# Patient Record
Sex: Female | Born: 1980 | Hispanic: No | Marital: Married | Smoking: Never smoker
Health system: Southern US, Community
[De-identification: ages and names within clinical notes are randomized; demographics above are authoritative.]

---

## 2013-08-19 NOTE — L&D Delivery Note (Signed)
Delivery Note At 4:15 AM a viable and healthy female was delivered via Vaginal, Spontaneous Delivery (Presentation: Right Occiput Anterior).  APGAR: 7, 9; weight pending .   Placenta status: Intact, Spontaneous.  Cord: 3 vessels with the following complications: None.    Anesthesia: Epidural  Episiotomy: None Lacerations: 2nd degree;Perineal and 1st degree right labial Suture Repair: 3.0 monocryl Est. Blood Loss (mL): 350   33 y.o. G2P1001 @[redacted]w[redacted]d  presented to MAU in active labor.  She progressed within a few hours to complete and pushed ~30 minutes to deliver.  Infant placed in mother's arms immediately after delivery.  Cord clamping delayed by a few minutes then clamped by CNM and cut by FOB.  Placenta spontaneous and intact.  Pitocin given IV bolus following placenta delivery.  2nd degree perineal tear repaired using 3.0 monocryl, then right labial tear repaired also using 3.0 monocryl.  Mother and baby stable,rooming in.    Mom to postpartum.  Baby to Couplet care / Skin to Skin.  LEFTWICH-KIRBY, Jazyiah Yiu 08/08/2014, 4:46 AM

## 2014-08-07 ENCOUNTER — Inpatient Hospital Stay (HOSPITAL_COMMUNITY): Payer: Self-pay | Admitting: Anesthesiology

## 2014-08-07 ENCOUNTER — Encounter (HOSPITAL_COMMUNITY): Payer: Self-pay | Admitting: *Deleted

## 2014-08-07 ENCOUNTER — Inpatient Hospital Stay (HOSPITAL_COMMUNITY): Payer: Self-pay

## 2014-08-07 ENCOUNTER — Inpatient Hospital Stay (HOSPITAL_COMMUNITY)
Admission: AD | Admit: 2014-08-07 | Discharge: 2014-08-09 | DRG: 775 | Disposition: A | Discharge status: Home / Self Care | Payer: Self-pay | Source: Ambulatory Visit | Attending: Family Medicine | Admitting: Family Medicine

## 2014-08-07 DIAGNOSIS — R58 Hemorrhage, not elsewhere classified: Secondary | ICD-10-CM | POA: Insufficient documentation

## 2014-08-07 DIAGNOSIS — O480 Post-term pregnancy: Principal | ICD-10-CM | POA: Diagnosis present

## 2014-08-07 DIAGNOSIS — IMO0001 Reserved for inherently not codable concepts without codable children: Secondary | ICD-10-CM

## 2014-08-07 DIAGNOSIS — Z3A40 40 weeks gestation of pregnancy: Secondary | ICD-10-CM | POA: Diagnosis present

## 2014-08-07 LAB — RPR

## 2014-08-07 LAB — HIV ANTIBODY (ROUTINE TESTING W REFLEX): HIV 1&2 Ab, 4th Generation: NONREACTIVE

## 2014-08-07 LAB — CBC
HCT: 36.6 % (ref 36.0–46.0)
HEMOGLOBIN: 13 g/dL (ref 12.0–15.0)
MCH: 32.2 pg (ref 26.0–34.0)
MCHC: 35.5 g/dL (ref 30.0–36.0)
MCV: 90.6 fL (ref 78.0–100.0)
Platelets: 115 10*3/uL — ABNORMAL LOW (ref 150–400)
RBC: 4.04 MIL/uL (ref 3.87–5.11)
RDW: 13.1 % (ref 11.5–15.5)
WBC: 7.2 10*3/uL (ref 4.0–10.5)

## 2014-08-07 LAB — DIFFERENTIAL
BASOS PCT: 0 % (ref 0–1)
Basophils Absolute: 0 10*3/uL (ref 0.0–0.1)
EOS ABS: 0.1 10*3/uL (ref 0.0–0.7)
Eosinophils Relative: 1 % (ref 0–5)
Lymphocytes Relative: 27 % (ref 12–46)
Lymphs Abs: 2 10*3/uL (ref 0.7–4.0)
Monocytes Absolute: 0.4 10*3/uL (ref 0.1–1.0)
Monocytes Relative: 6 % (ref 3–12)
Neutro Abs: 4.8 10*3/uL (ref 1.7–7.7)
Neutrophils Relative %: 66 % (ref 43–77)

## 2014-08-07 LAB — HEPATITIS B SURFACE ANTIGEN: Hepatitis B Surface Ag: NEGATIVE

## 2014-08-07 LAB — TYPE AND SCREEN
ABO/RH(D): B POS
ANTIBODY SCREEN: NEGATIVE

## 2014-08-07 MED ORDER — LACTATED RINGERS IV SOLN
500.0000 mL | Freq: Once | INTRAVENOUS | Status: AC
  Administered 2014-08-07: 500 mL via INTRAVENOUS

## 2014-08-07 MED ORDER — CITRIC ACID-SODIUM CITRATE 334-500 MG/5ML PO SOLN: 30.0000 mL | ORAL | Status: DC | PRN

## 2014-08-07 MED ORDER — PHENYLEPHRINE 40 MCG/ML (10ML) SYRINGE FOR IV PUSH (FOR BLOOD PRESSURE SUPPORT)
80.0000 ug | PREFILLED_SYRINGE | INTRAVENOUS | Status: DC | PRN
  Filled 2014-08-07: qty 10

## 2014-08-07 MED ORDER — OXYTOCIN BOLUS FROM INFUSION
500.0000 mL | INTRAVENOUS | Status: DC
  Administered 2014-08-08: 500 mL via INTRAVENOUS

## 2014-08-07 MED ORDER — FENTANYL 2.5 MCG/ML BUPIVACAINE 1/10 % EPIDURAL INFUSION (WH - ANES)
14.0000 mL/h | INTRAMUSCULAR | Status: DC | PRN
  Administered 2014-08-07: 14 mL/h via EPIDURAL
  Filled 2014-08-07: qty 125

## 2014-08-07 MED ORDER — DIPHENHYDRAMINE HCL 50 MG/ML IJ SOLN: 12.5000 mg | INTRAMUSCULAR | Status: DC | PRN

## 2014-08-07 MED ORDER — LIDOCAINE HCL (PF) 1 % IJ SOLN
INTRAMUSCULAR | Status: DC | PRN
  Administered 2014-08-07 (×2): 5 mL

## 2014-08-07 MED ORDER — LACTATED RINGERS IV SOLN
INTRAVENOUS | Status: DC
  Administered 2014-08-07 – 2014-08-08 (×2): via INTRAVENOUS

## 2014-08-07 MED ORDER — LACTATED RINGERS IV SOLN
500.0000 mL | INTRAVENOUS | Status: DC | PRN
  Administered 2014-08-08: 500 mL via INTRAVENOUS

## 2014-08-07 MED ORDER — EPHEDRINE 5 MG/ML INJ: 10.0000 mg | INTRAVENOUS | Status: DC | PRN

## 2014-08-07 MED ORDER — ACETAMINOPHEN 325 MG PO TABS: 650.0000 mg | ORAL_TABLET | ORAL | Status: DC | PRN

## 2014-08-07 MED ORDER — ONDANSETRON HCL 4 MG/2ML IJ SOLN: 4.0000 mg | Freq: Four times a day (QID) | INTRAMUSCULAR | Status: DC | PRN

## 2014-08-07 MED ORDER — OXYCODONE-ACETAMINOPHEN 5-325 MG PO TABS: 2.0000 | ORAL_TABLET | ORAL | Status: DC | PRN

## 2014-08-07 MED ORDER — PHENYLEPHRINE 40 MCG/ML (10ML) SYRINGE FOR IV PUSH (FOR BLOOD PRESSURE SUPPORT): 80.0000 ug | PREFILLED_SYRINGE | INTRAVENOUS | Status: DC | PRN

## 2014-08-07 MED ORDER — OXYTOCIN 40 UNITS IN LACTATED RINGERS INFUSION - SIMPLE MED
62.5000 mL/h | INTRAVENOUS | Status: DC
  Filled 2014-08-07: qty 1000

## 2014-08-07 MED ORDER — LIDOCAINE HCL (PF) 1 % IJ SOLN
30.0000 mL | INTRAMUSCULAR | Status: DC | PRN
  Filled 2014-08-07: qty 30

## 2014-08-07 MED ORDER — OXYCODONE-ACETAMINOPHEN 5-325 MG PO TABS: 1.0000 | ORAL_TABLET | ORAL | Status: DC | PRN

## 2014-08-07 NOTE — Anesthesia Procedure Notes (Signed)
Epidural Patient location during procedure: OB Start time: 08/07/2014 11:42 PM  Staffing Anesthesiologist: Brayton CavesJACKSON, Angie Piercey Performed by: anesthesiologist   Preanesthetic Checklist Completed: patient identified, site marked, surgical consent, pre-op evaluation, timeout performed, IV checked, risks and benefits discussed and monitors and equipment checked  Epidural Patient position: sitting Prep: site prepped and draped and DuraPrep Patient monitoring: continuous pulse ox and blood pressure Approach: midline Location: L3-L4 Injection technique: LOR air  Needle:  Needle type: Tuohy  Needle gauge: 17 G Needle length: 9 cm and 9 Needle insertion depth: 5 cm cm Catheter type: closed end flexible Catheter size: 19 Gauge Catheter at skin depth: 10 cm Test dose: negative  Assessment Events: blood not aspirated, injection not painful, no injection resistance, negative IV test and no paresthesia  Additional Notes Patient identified.  Risk benefits discussed including failed block, incomplete pain control, headache, nerve damage, paralysis, blood pressure changes, nausea, vomiting, reactions to medication both toxic or allergic, and postpartum back pain.  Patient expressed understanding and wished to proceed.  All questions were answered.  Sterile technique used throughout procedure and epidural site dressed with sterile barrier dressing. No paresthesia or other complications noted.The patient did not experience any signs of intravascular injection such as tinnitus or metallic taste in mouth nor signs of intrathecal spread such as rapid motor block. Please see nursing notes for vital signs.

## 2014-08-07 NOTE — MAU Note (Signed)
Pt presents to MAU with complaints of contractions with vaginal bleeding for 4-5 hours. Reports baby is active

## 2014-08-07 NOTE — Anesthesia Preprocedure Evaluation (Signed)

## 2014-08-07 NOTE — Plan of Care (Signed)
Problem: Consults Goal: Birthing Suites Patient Information Press F2 to bring up selections list  Outcome: Completed/Met Date Met:  08/07/14  Pt 37-[redacted] weeks EGA and Non-English Speaking

## 2014-08-07 NOTE — MAU Provider Note (Signed)
  History     CSN: [id number]  Arrival date and time: 08/07/14 1742   First Provider Initiated Contact with Patient 08/07/14 1828      Chief Complaint  Patient presents with  . Vaginal Bleeding  . Contractions   HPI This is a 33 y.o. female at 5559w0d by LMP who presents with c/o contractions and vaginal bleeding for the past 5 hours. States contractions are only every 20 min. [place].  Does not have any records "because they are all on computer there".  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 States her blood type is B+ and her placenta "is in a good position" but does not know where. States everything has been normal except she has had some low platelets at 105K. States last delivery was normal and lasted 15 hours. [facility]. Does not know how long she will be here but will go back home after the baby is born.   RN note: Pt presents to MAU with complaints of contractions with vaginal bleeding for 4-5 hours. Reports baby is active          OB History    Gravida Para Term Preterm AB TAB SAB Ectopic Multiple Living   2 1 1       1       History reviewed. No pertinent past medical history.  History reviewed. No pertinent past surgical history.  History reviewed. No pertinent family history.  History  Substance Use Topics  . Smoking status: Never Smoker   . Smokeless tobacco: Not on file  . Alcohol Use: Not on file    Allergies: No Known Allergies  No prescriptions prior to admission    Review of Systems  Constitutional: Negative for fever, chills and malaise/fatigue.  Gastrointestinal: Positive for abdominal pain. Negative for nausea and vomiting.  Genitourinary: Negative for dysuria.       Vaginal bleeding   Neurological: Negative for dizziness and headaches.   Physical Exam   Blood pressure 130/66, pulse 100, temperature 99 F (37.2 C), resp. rate 18, height 5\' 3"  (1.6  m), weight 139 lb (63.05 kg), last menstrual period 10/31/2013.  Physical Exam  Constitutional: She is oriented to person, place, and time. She appears well-developed and well-nourished. No distress (but appears uncomfortable).  HENT:  Head: Normocephalic.  Cardiovascular: Normal rate.   Respiratory: Effort normal.  GI: Soft.  Genitourinary: Vaginal discharge (blood tinged mucous) found.  Dilation: 1 Effacement (%): 70 Cervical Position: Posterior Station: -2 Presentation: Vertex Exam by:: Artelia LarocheM. Chalet Kerwin CNM   Musculoskeletal: Normal range of motion.  Neurological: She is alert and oriented to person, place, and time.  Skin: Skin is warm and dry.  Psychiatric: She has a normal mood and affect.   FHR reactive UCs irregular, every 2-8 min  MAU Course  Procedures  MDM Prenatal labs, limited US (to check placenta) and cultures ordered, including GBS.  Assessment and Plan  Report to oncoming CNM  [facility] LLCWILLIAMS,Dalon Reichart 08/07/2014, 6:41 PM

## 2014-08-07 NOTE — MAU Note (Signed)
Pt has had no PNC and reports LMP was March 15th of 2015

## 2014-08-07 NOTE — H&P (Signed)
Erika Owen is a 33 y.o. female G2P1001 @[redacted]w[redacted]d  [place] in [place] presenting for labor evaluation.  She reports onset of contractions during the day today, becoming stronger and closer together before arrival in MAU.  She does not have records of her prenatal care but reports her pregnancy course has been normal and her previous pregnancy was normal with term NSVD.  She reports good fetal movement, denies LOF, vaginal bleeding, vaginal itching/burning, urinary symptoms, h/a, dizziness, n/v, or fever/chills.     Maternal Medical History:  Reason for admission: Nausea.    OB History    Gravida Para Term Preterm AB TAB SAB Ectopic Multiple Living   2 1 1       1      History reviewed. No pertinent past medical history. History reviewed. No pertinent past surgical history. Family History: family history is not on file. Social History:  reports that she has never smoked. She does not have any smokeless tobacco history on file. Her alcohol and drug histories are not on file.   Prenatal Transfer Tool  Maternal Diabetes: No Genetic Screening: Normal Maternal Ultrasounds/Referrals: Normal Fetal Ultrasounds or other Referrals:  None Maternal Substance Abuse:  No Significant Maternal Medications:  None Significant Maternal Lab Results:  Lab values include: Other:  Other Comments:  GBS unkown--prenatal records unavailable from [place] but labs drawn on admission  Review of Systems  Constitutional: Negative for fever, chills and malaise/fatigue.  Eyes: Negative for blurred vision.  Respiratory: Negative for cough and shortness of breath.   Cardiovascular: Negative for chest pain.  Gastrointestinal: Positive for abdominal pain. Negative for heartburn, nausea and vomiting.  Genitourinary: Negative for dysuria, urgency and frequency.  Musculoskeletal: Negative.   Neurological: Negative for dizziness and headaches.  Psychiatric/Behavioral: Negative for depression.    Dilation:  3 Effacement (%): 80 Station: -2 Exam by:: Sharen CounterLisa Leftwich Kirby CNM Blood pressure 130/66, pulse 100, temperature 99 F (37.2 C), resp. rate 18, height 5\' 3"  (1.6 m), weight 63.05 kg (139 lb), last menstrual period 10/31/2013. Maternal Exam:  Uterine Assessment: Contraction strength is moderate.  Contraction duration is 1 minute. Contraction frequency is regular.   Abdomen: Fetal presentation: vertex  Cervix: Cervix evaluated by digital exam.     Fetal Exam Fetal Monitor Review: Mode: ultrasound.   Baseline rate: 135.  Variability: moderate (6-25 bpm).   Pattern: accelerations present.    Fetal State Assessment: Category I - tracings are normal.     Physical Exam  Nursing note and vitals reviewed. Constitutional: She is oriented to person, place, and time. She appears well-developed and well-nourished.  Neck: Normal range of motion.  Cardiovascular: Normal rate, regular rhythm and normal heart sounds.   Respiratory: Effort normal.  GI: Soft.  Musculoskeletal: Normal range of motion.  Neurological: She is alert and oriented to person, place, and time. She has normal reflexes.  Skin: Skin is warm and dry.  Psychiatric: She has a normal mood and affect. Her behavior is normal. Judgment and thought content normal.    Prenatal labs: ABO, Rh: --/--/B POS (12/20 1850) Antibody: NEG (12/20 1850) Rubella:   RPR: NON REAC (12/20 1850)  HBsAg:    HIV: NONREACTIVE (12/20 1850)  GBS:   Unknown--rapid GBS collected on admission  Assessment/Plan: .33 y.o. G2P1001 @[redacted]w[redacted]d  Active labor at term GBS unknown at term  [facility] GBS collected Prenatal labs pending May have epidural when desired Anticipate NSVD   LEFTWICH-KIRBY, Erika Owen 08/07/2014, 9:55 PM

## 2014-08-08 ENCOUNTER — Encounter (HOSPITAL_COMMUNITY): Payer: Self-pay

## 2014-08-08 DIAGNOSIS — Z3A40 40 weeks gestation of pregnancy: Secondary | ICD-10-CM

## 2014-08-08 DIAGNOSIS — R58 Hemorrhage, not elsewhere classified: Secondary | ICD-10-CM | POA: Insufficient documentation

## 2014-08-08 DIAGNOSIS — O480 Post-term pregnancy: Secondary | ICD-10-CM

## 2014-08-08 LAB — CBC
HCT: 34.8 % — ABNORMAL LOW (ref 36.0–46.0)
HEMOGLOBIN: 12.2 g/dL (ref 12.0–15.0)
MCH: 31.9 pg (ref 26.0–34.0)
MCHC: 35.1 g/dL (ref 30.0–36.0)
MCV: 90.9 fL (ref 78.0–100.0)
Platelets: 107 10*3/uL — ABNORMAL LOW (ref 150–400)
RBC: 3.83 MIL/uL — ABNORMAL LOW (ref 3.87–5.11)
RDW: 13 % (ref 11.5–15.5)
WBC: 9.9 10*3/uL (ref 4.0–10.5)

## 2014-08-08 LAB — URINALYSIS, ROUTINE W REFLEX MICROSCOPIC
Bilirubin Urine: NEGATIVE
Glucose, UA: NEGATIVE mg/dL
Hgb urine dipstick: NEGATIVE
Ketones, ur: >80 mg/dL — AB
Leukocytes, UA: NEGATIVE
NITRITE: NEGATIVE
PROTEIN: NEGATIVE mg/dL
SPECIFIC GRAVITY, URINE: 1.025 (ref 1.005–1.030)
UROBILINOGEN UA: 0.2 mg/dL (ref 0.0–1.0)
pH: 6 (ref 5.0–8.0)

## 2014-08-08 LAB — ABO/RH: ABO/RH(D): B POS

## 2014-08-08 LAB — RAPID URINE DRUG SCREEN, HOSP PERFORMED
Amphetamines: NOT DETECTED
BARBITURATES: NOT DETECTED
BENZODIAZEPINES: NOT DETECTED
Cocaine: NOT DETECTED
Opiates: NOT DETECTED
TETRAHYDROCANNABINOL: NOT DETECTED

## 2014-08-08 LAB — GC/CHLAMYDIA PROBE AMP
CT Probe RNA: NEGATIVE
GC Probe RNA: NEGATIVE

## 2014-08-08 LAB — RUBELLA SCREEN: RUBELLA: 3.03 {index} — AB (ref <0.90)

## 2014-08-08 MED ORDER — OXYCODONE-ACETAMINOPHEN 5-325 MG PO TABS: 1.0000 | ORAL_TABLET | ORAL | Status: DC | PRN

## 2014-08-08 MED ORDER — LANOLIN HYDROUS EX OINT
TOPICAL_OINTMENT | CUTANEOUS | Status: DC | PRN
Start: 2014-08-08 — End: 2014-08-09

## 2014-08-08 MED ORDER — ZOLPIDEM TARTRATE 5 MG PO TABS: 5.0000 mg | ORAL_TABLET | Freq: Every evening | ORAL | Status: DC | PRN

## 2014-08-08 MED ORDER — SENNOSIDES-DOCUSATE SODIUM 8.6-50 MG PO TABS
2.0000 | ORAL_TABLET | ORAL | Status: DC
  Filled 2014-08-08: qty 2

## 2014-08-08 MED ORDER — TETANUS-DIPHTH-ACELL PERTUSSIS 5-2.5-18.5 LF-MCG/0.5 IM SUSP: 0.5000 mL | Freq: Once | INTRAMUSCULAR | Status: DC

## 2014-08-08 MED ORDER — OXYCODONE-ACETAMINOPHEN 5-325 MG PO TABS: 2.0000 | ORAL_TABLET | ORAL | Status: DC | PRN

## 2014-08-08 MED ORDER — ONDANSETRON HCL 4 MG PO TABS: 4.0000 mg | ORAL_TABLET | ORAL | Status: DC | PRN

## 2014-08-08 MED ORDER — DIPHENHYDRAMINE HCL 25 MG PO CAPS: 25.0000 mg | ORAL_CAPSULE | Freq: Four times a day (QID) | ORAL | Status: DC | PRN

## 2014-08-08 MED ORDER — SIMETHICONE 80 MG PO CHEW: 80.0000 mg | CHEWABLE_TABLET | ORAL | Status: DC | PRN

## 2014-08-08 MED ORDER — ONDANSETRON HCL 4 MG/2ML IJ SOLN: 4.0000 mg | INTRAMUSCULAR | Status: DC | PRN

## 2014-08-08 MED ORDER — DIBUCAINE 1 % RE OINT
1.0000 "application " | TOPICAL_OINTMENT | RECTAL | Status: DC | PRN
  Filled 2014-08-08: qty 28

## 2014-08-08 MED ORDER — BENZOCAINE-MENTHOL 20-0.5 % EX AERO
1.0000 "application " | INHALATION_SPRAY | CUTANEOUS | Status: DC | PRN
  Administered 2014-08-08: 1 via TOPICAL
  Filled 2014-08-08 (×2): qty 56

## 2014-08-08 MED ORDER — WITCH HAZEL-GLYCERIN EX PADS: 1.0000 "application " | MEDICATED_PAD | CUTANEOUS | Status: DC | PRN

## 2014-08-08 MED ORDER — IBUPROFEN 600 MG PO TABS
600.0000 mg | ORAL_TABLET | Freq: Four times a day (QID) | ORAL | Status: DC
  Administered 2014-08-08: 600 mg via ORAL
  Filled 2014-08-08 (×3): qty 1

## 2014-08-08 MED ORDER — PRENATAL MULTIVITAMIN CH
1.0000 | ORAL_TABLET | Freq: Every day | ORAL | Status: DC
  Filled 2014-08-08: qty 1

## 2014-08-08 NOTE — Progress Notes (Signed)
Erika Owen is a 33 y.o. G2P1001 at 7271w1d by LMP admitted for active labor  Subjective: Pt comfortable now with epidural.    Objective: BP 105/65 mmHg  Pulse 77  Temp(Src) 97.8 F (36.6 C) (Oral)  Resp 18  Ht 5\' 3"  (1.6 m)  Wt 63.05 kg (139 lb)  BMI 24.63 kg/m2  SpO2 100%  LMP 10/31/2013      FHT:  FHR: 120 bpm, variability: moderate,  accelerations:  Present,  decelerations:  Absent UC:   regular, every 5-6 minutes SVE:   Dilation: 5 Effacement (%): 80 Station: -1 Exam by:: e. poore, rn  Labs: Lab Results  Component Value Date   WBC 7.2 08/07/2014   HGB 13.0 08/07/2014   HCT 36.6 08/07/2014   MCV 90.6 08/07/2014   PLT 115* 08/07/2014    Assessment / Plan: Spontaneous labor, progressing normally  Labor: Progressing normally Preeclampsia:  n/a Fetal Wellbeing:  Category I Pain Control:  Epidural I/D:  n/a Anticipated MOD:  NSVD  LEFTWICH-KIRBY, [name] 08/08/2014, 12:12 AM 08/08/2014, 12:12 AM

## 2014-08-08 NOTE — Anesthesia Postprocedure Evaluation (Signed)
Anesthesia Post Note  Patient: Erika Owen  Procedure(s) Performed: * No procedures listed *  Anesthesia type: Epidural  Patient location: Mother/Baby  Post pain: Pain level controlled  Post assessment: Post-op Vital signs reviewed  Last Vitals:  Filed Vitals:   08/08/14 0705  BP: 102/63  Pulse: 72  Temp: 36.4 C  Resp: 18    Post vital signs: Reviewed  Level of consciousness:alert  Complications: No apparent anesthesia complications

## 2014-08-08 NOTE — Lactation Note (Signed)
This note was copied from the chart of Erika Owen. Lactation Consultation Note Initial visit at 12 hours of age.  Mom is asleep, but FOB speaks good english and shares baby just finished a feeding.  Baby is now asleep in crib.  Baby has had 6 feedings with a void and stool.   FOB declines assistance.  [facility] resources given and discussed.  Encouraged FOB to have mom to call for assist as needed, FOB further declines assistance, reporting everyone in their family breastfeeds.  Discussed usual output for 1st week of life.  [facility] Kristen who will again ask mom when she is awake if she needs Lactation services.  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       Patient Name: Erika Breahna Larose Today's Date: 08/08/2014 Reason for consult: Initial assessment   Maternal Data    Feeding Feeding Type: Breast Fed Length of feed: 15 min  LATCH Score/Interventions                      Lactation Tools Discussed/Used     Consult Status Consult Status: Complete    Vanessia Bokhari, Arvella MerlesJana Lynn 08/08/2014, 4:23 PM

## 2014-08-08 NOTE — Progress Notes (Signed)
UR chart review completed.  

## 2014-08-09 MED ORDER — IBUPROFEN 600 MG PO TABS: 600.0000 mg | ORAL_TABLET | Freq: Four times a day (QID) | ORAL | Status: AC

## 2014-08-09 NOTE — Progress Notes (Signed)
Clinical Social Work Department PSYCHOSOCIAL ASSESSMENT - MATERNAL/CHILD Late Entry 08/08/2014  Patient:  ANTIA, RAHAL  Account Number:  [id number]  Admit Date:  08/07/2014  Ardine Eng Name:   Genene Churn   Clinical Social Worker:  Lucita Ferrara, CLINICAL SOCIAL WORKER   Date/Time:  08/08/2014 05:00 PM  Date Referred:  08/08/2014   Referral source  Central Nursery     Referred reason  [facility]   Other referral source:    I:  FAMILY / HOME ENVIRONMENT Child's legal guardian:  PARENT  Guardian - Name Guardian - Age Guardian - Address  [address], [place] [address]  Father of the baby  same as above   Other household support members/support persons Name Relationship DOB   DAUGHTER 54 years old   Other support:   MOB and FOB reported that they have one friend in [place] that has assisted them navigate [place] and available resources.    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   Financial resources:  Self Pay If Medicaid - County:    School / Grade:  N/A Music therapist / Child Services Coordination / Early Interventions:   None reported  Cultural issues impacting care:   MOB and FOB moved from [place] and then [place] during final trimester of the pregnancy.  MOB and FOB reported concerns about financial ability to pay for the hospitalization due to not having access to insurance. They originally declined services at the hospital out of fear of the cost, but have been agreeable and understanding of hospital policy when meeting with the MD and the CSW.    III  STRENGTHS Strengths  Adequate Resources  Home prepared for Child (including basic supplies)   Strength comment:    IV  RISK FACTORS AND CURRENT PROBLEMS Current Problem:  YES   Risk  Factor & Current Problem Patient Issue Family Issue Risk Factor / Current Problem Comment  Other - See comment Y N MOB and FOB report being refugees from [place] (via [place]).  Unable to verify prenatal records. Baby's UDS is negative.  Other - See comment Y N MOB and FOB concerned about cost of hospital bill and originally declined treatment at the hospital and outpatient follow-up out of fear of cost.    V  SOCIAL WORK ASSESSMENT CSW met with the MOB due to inability to confirm prenatal care.  MOB and FOB reported that they are [place] refugees, received prenatal care prior to arrival the [place], but have not established care in the [place].  CSW also received phone call from pediatrician due to MOB and FOB originally requesting discharge prior to baby's readiness and potential concerns since the MOB and FOB were discussing ability to be the baby's pediatricians since they are in doctors in [place].  MOB and FOB were receptive to the visit from [place].  The FOB provided majority of the history, and the MOB was observed to be attending to their older daughter and the baby.    Per FOB, they are [place] refugees, but discussed that they do not have a refugee visa.  He shared that they arrived on their own (without a sponsorship/agency), and have one friend that lives in [place]. He discussed belief that the friend is supportive and has been helpful for them as  they learn to navigate resources.  The FOB shared that they have an apartment, and enough money to pay their basic bills/rent. 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  The FOB did not identify any long term plan in terms of their living arrangements in the [place], but confirmed belief that he is able to ensure that all basic needs are met.    The FOB expressed concern about length of stay at the hospital and need to follow-up with a pediatrician due to cost.  He repeatedly stated that he is unsure how  they are going to be able to pay for the hospital stay or the follow-up appointments since they only have enough to pay for their basic everyday needs.  He shared that he was only requesting an early discharge out of concern of the hospital bill since they do not have insurance.   The FOB verbalized understanding of hospital protocol for length of stay and need for follow-up, and was agreeable.  He stated belief that his family is in "good hands", and shared that there health is his top priority. The FOB acknowledged that he will receive extra support/assistance when needing to identify a pediatrician. The FOB also confirmed that he has spoken with the financial counselor at the hospital regarding Medicaid.   CSW also discussed hospital drug screen policy due to inability to confirm medical records. The FOB denied any drug use during the pregnancy, but verbalized understanding of the policy.  He repeatedly stated that he and the MOB were agreeable to any and all policies at the hospital if it is in the best interest of the family's health.    No barriers to discharge.   VI SOCIAL WORK PLAN Social Work Secretary/administrator Education   Type of pt/family education:   Hospital drug screen policy   If child protective services report - county:   If child protective services report - date:   Information/referral to community resources comment:   Other social work plan:   CSW to follow-up PRN.

## 2014-08-09 NOTE — Discharge Summary (Signed)
Obstetric Discharge Summary Reason for Admission: onset of labor Prenatal Procedures: none Intrapartum Procedures: spontaneous vaginal delivery Postpartum Procedures: none Complications-Operative and Postpartum: 1st and 2nd  degree perineal laceration HEMOGLOBIN  Date Value Ref Range Status  08/08/2014 12.2 12.0 - 15.0 g/dL Final   HCT  Date Value Ref Range Status  08/08/2014 34.8* 36.0 - 46.0 % Final    Physical Exam:  General: alert, cooperative, appears stated age and no distress Lochia: appropriate Uterine Fundus: firm Incision: healing well, no significant drainage, no dehiscence, no significant erythema DVT Evaluation: No evidence of DVT seen on physical exam. Negative Homan's sign.  Hospital Course: Nema Charmian Mufflshmayt is a 33 y.o. now G2P2002 status post NSVD on 08/08/2014. She was admitted on 12/20 admitted in early active labor. She progressed without augmentation and delivered a viable female. Delivery was complicated by 2nd degree perineal and 1st degree right labial laceration, repaired in standard fashion. Post partum course has otherwise been uncomplicated.   Discharge Diagnoses: Term Pregnancy-delivered  Discharge Information: Date: 08/09/2014 Activity: pelvic rest Diet: routine Medications: Ibuprofen Condition: stable Instructions: refer to practice specific booklet Discharge to: home   Newborn Data: Live born female  Birth Weight: 7 lb 0.3 oz (3184 g) APGAR: 7, 9  Home with mother.  Benjamin Stainhompson, McKenzie L, MD 08/09/2014, 6:50 AM   OB fellow attestation Post Partum Day 1 I have seen and examined this patient and agree with above documentation in the resident's note.   Jelene Rockholt is a 33 y.o. [id number] s/p NSVD.  Hospital course added to note above. Pt denies problems with ambulating, voiding or po intake. Pain is well controlled.  Plan for birth control is natural family planning (NFP), breastfeeding.  Method of Feeding: breast  PE:  BP 84/59 mmHg  Pulse  81  Temp(Src) 97.7 F (36.5 C) (Oral)  Resp 16  Ht 5\' 3"  (1.6 m)  Wt 139 lb (63.05 kg)  BMI 24.63 kg/m2  SpO2 100%  LMP 10/31/2013  Breastfeeding? Unknown Fundus firm  Plan for discharge: PPD#1  William DaltonMcEachern, Aishwarya Shiplett, MD 7:16 AM

## 2014-08-09 NOTE — Discharge Instructions (Signed)

## 2017-12-07 ENCOUNTER — Emergency Department: Admission: EM | Admit: 2017-12-07 | Discharge: 2017-12-07 | Discharge status: Absent without leave | Payer: Self-pay
